# Patient Record
Sex: Female | Born: 1995 | Race: White | Hispanic: No | Marital: Single | Smoking: Never smoker
Health system: Southern US, Community
[De-identification: ages and names within clinical notes are randomized; demographics above are authoritative.]

## PROBLEM LIST (undated history)

## (undated) DIAGNOSIS — F32A Depression, unspecified: Secondary | ICD-10-CM

## (undated) DIAGNOSIS — A749 Chlamydial infection, unspecified: Secondary | ICD-10-CM

## (undated) DIAGNOSIS — F419 Anxiety disorder, unspecified: Secondary | ICD-10-CM

## (undated) DIAGNOSIS — M779 Enthesopathy, unspecified: Secondary | ICD-10-CM

## (undated) DIAGNOSIS — F329 Major depressive disorder, single episode, unspecified: Secondary | ICD-10-CM

---

## 2017-06-14 ENCOUNTER — Emergency Department
Admission: EM | Admit: 2017-06-14 | Discharge: 2017-06-14 | Disposition: A | Discharge status: Home / Self Care | Payer: Commercial Managed Care - PPO | Attending: Emergency Medicine | Admitting: Emergency Medicine

## 2017-06-14 ENCOUNTER — Emergency Department: Payer: Commercial Managed Care - PPO

## 2017-06-14 DIAGNOSIS — Z79899 Other long term (current) drug therapy: Secondary | ICD-10-CM | POA: Insufficient documentation

## 2017-06-14 DIAGNOSIS — R202 Paresthesia of skin: Secondary | ICD-10-CM | POA: Insufficient documentation

## 2017-06-14 DIAGNOSIS — R200 Anesthesia of skin: Secondary | ICD-10-CM | POA: Diagnosis present

## 2017-06-14 HISTORY — DX: Enthesopathy, unspecified: M77.9

## 2017-06-14 LAB — CBC
HCT: 43.1 % (ref 35.0–47.0)
HEMOGLOBIN: 14.8 g/dL (ref 12.0–16.0)
MCH: 29.7 pg (ref 26.0–34.0)
MCHC: 34.4 g/dL (ref 32.0–36.0)
MCV: 86.2 fL (ref 80.0–100.0)
Platelets: 231 10*3/uL (ref 150–440)
RBC: 5 MIL/uL (ref 3.80–5.20)
RDW: 12.9 % (ref 11.5–14.5)
WBC: 7.7 10*3/uL (ref 3.6–11.0)

## 2017-06-14 LAB — COMPREHENSIVE METABOLIC PANEL
ALT: 14 U/L (ref 14–54)
AST: 22 U/L (ref 15–41)
Albumin: 4.5 g/dL (ref 3.5–5.0)
Alkaline Phosphatase: 59 U/L (ref 38–126)
Anion gap: 11 (ref 5–15)
BUN: 7 mg/dL (ref 6–20)
CALCIUM: 9.2 mg/dL (ref 8.9–10.3)
CO2: 24 mmol/L (ref 22–32)
CREATININE: 0.6 mg/dL (ref 0.44–1.00)
Chloride: 102 mmol/L (ref 101–111)
GFR calc non Af Amer: >60 mL/min (ref >60)
GLUCOSE: 92 mg/dL (ref 65–99)
Potassium: 3.6 mmol/L (ref 3.5–5.1)
SODIUM: 137 mmol/L (ref 135–145)
Total Bilirubin: 0.6 mg/dL (ref 0.3–1.2)
Total Protein: 8 g/dL (ref 6.5–8.1)

## 2017-06-14 LAB — POCT PREGNANCY, URINE: Preg Test, Ur: NEGATIVE

## 2017-06-14 MED ORDER — IOPAMIDOL (ISOVUE-370) INJECTION 76%
75.0000 mL | Freq: Once | INTRAVENOUS | Status: AC | PRN
  Administered 2017-06-14: 75 mL via INTRAVENOUS

## 2017-06-14 NOTE — ED Triage Notes (Signed)
Patient reports a feeling of malaise/discomfort. Patient reports she then began to have left arm numbness and left leg numbness. Patient took her BP and it read 152 systolic.  Patient reports hx of previous numbness in extremities following a concussion.

## 2017-06-14 NOTE — ED Provider Notes (Signed)
[facility] Emergency Department Provider Note   I have reviewed the triage vital signs and the nursing notes.   HISTORY  Chief Complaint Hypertension    HPI Kendra Robertson is a 22 y.o. female presents to the emergency department with left arm and leg numbness times 1 day associated with elevated blood pressure at home of 152 systolic.  Patient states that previous episode of the same following a concussion however no head injury at all.  Patient denies any headache.  Patient does admit to dizziness intermittently however patient denies any visual changes.  Patient denies any gait instability.  Patient denies any weakness.   Past Medical History:  Diagnosis Date  . Tendonitis   Concussion Migraine headache  There are no active problems to display for this patient.   Past surgical history None  Prior to Admission medications   Medication Sig Start Date End Date Taking? Authorizing Provider  CRYSELLE-28 0.3-30 MG-MCG tablet Take 1 tablet by mouth daily. 06/06/17   [provider]  escitalopram (LEXAPRO) 10 MG tablet Take 30 mg by mouth daily. 05/26/17   [provider]  ZOMIG 5 MG nasal solution Place 1 spray into the nose as needed for migraine. 04/15/17   [provider]    Allergies No known drug allergies  Social History Social History   Tobacco Use  . Smoking status: Never Smoker  . Smokeless tobacco: Never Used  Substance Use Topics  . Alcohol use: Yes  . Drug use: Not on file    Review of Systems Constitutional: No fever/chills Eyes: No visual changes. ENT: No sore throat. Cardiovascular: Denies chest pain. Respiratory: Denies shortness of breath. Gastrointestinal: No abdominal pain.  No nausea, no vomiting.  No diarrhea.  No constipation. Genitourinary: Negative for dysuria. Musculoskeletal: Negative for neck pain.  Negative for back pain. Integumentary: Negative for rash. Neurological: Negative for  headaches, focal weakness or numbness.   ____________________________________________   PHYSICAL EXAM:  VITAL SIGNS: ED Triage Vitals  Enc Vitals Group     BP 06/14/17 0106 (!) 148/96     Pulse Rate 06/14/17 0106 79     Resp 06/14/17 0106 18     Temp 06/14/17 0106 98.6 F (37 C)     Temp Source 06/14/17 0106 Oral     SpO2 06/14/17 0106 100 %     Weight 06/14/17 0105 72.6 kg (160 lb)     Height 06/14/17 0105 1.676 m (5\' 6" )     Head Circumference --      Peak Flow --      Pain Score 06/14/17 0110 5     Pain Loc --      Pain Edu? --      Excl. in GC? --     Constitutional: Alert and oriented. Well appearing and in no acute distress. Eyes: Conjunctivae are normal. PERRL. EOMI. no nystagmus Head: Atraumatic. Mouth/Throat: Mucous membranes are moist.  Oropharynx non-erythematous. Neck: No stridor.  No meningeal signs.   Cardiovascular: Normal rate, regular rhythm. Good peripheral circulation. Grossly normal heart sounds. Respiratory: Normal respiratory effort.  No retractions. Lungs CTAB. Gastrointestinal: Soft and nontender. No distention.  Musculoskeletal: No lower extremity tenderness nor edema. No gross deformities of extremities. Neurologic:  Normal speech and language. No gross focal neurologic deficits are appreciated.  Skin:  Skin is warm, dry and intact. No rash noted. Psychiatric: Mood and affect are normal. Speech and behavior are normal.  ____________________________________________   LABS (all labs ordered are  listed, but only abnormal results are displayed)  Labs Reviewed  CBC  COMPREHENSIVE METABOLIC PANEL  POCT PREGNANCY, URINE     RADIOLOGY I, Chardon N BROWN, personally viewed and evaluated these images (plain radiographs) as part of my medical decision making, as well as reviewing the written report by the radiologist.    Official radiology report(s): Ct Angio Head W Or Wo Contrast  Result Date: 06/14/2017 CLINICAL DATA:  22 y/o F; malaise  and discomfort. Subsequent left arm numbness and left leg numbness. EXAM: CT ANGIOGRAPHY HEAD TECHNIQUE: Multidetector CT imaging of the head was performed using the standard protocol during bolus administration of intravenous contrast. Multiplanar CT image reconstructions and MIPs were obtained to evaluate the vascular anatomy. CONTRAST:  75mL ISOVUE-370 IOPAMIDOL (ISOVUE-370) INJECTION 76% COMPARISON:  None. FINDINGS: CT HEAD Brain: No evidence of acute infarction, hemorrhage, hydrocephalus, extra-axial collection or mass lesion/mass effect. Vascular: As below. Skull: Normal. Negative for fracture or focal lesion. Sinuses: Imaged portions are clear. Orbits: No acute finding. CTA HEAD Anterior circulation: No significant stenosis, proximal occlusion, aneurysm, or vascular malformation. Posterior circulation: No significant stenosis, proximal occlusion, aneurysm, or vascular malformation. Venous sinuses: As permitted by contrast timing, patent. Anatomic variants: Bilateral posterior and anterior communicating arteries are present. Delayed phase: No abnormal intracranial enhancement. IMPRESSION: 1. Negative noncontrast CT of head. No abnormal enhancement of the brain. 2. Patent anterior and posterior intracranial circulation. No large vessel occlusion, aneurysm, or significant stenosis. Electronically Signed   By: Mitzi Hansen M.D.   On: 06/14/2017 03:08    Procedures   ____________________________________________   INITIAL IMPRESSION / ASSESSMENT AND PLAN / ED COURSE  As part of my medical decision making, I reviewed the following data within the electronic MEDICAL RECORD NUMBER21 year old female presented with above-stated history of dizziness with left arm and leg numbness.  No focal neurological deficits noted on exam including no numbness of the left upper or lower extremity.  CT angiogram of the brain revealed no acute intracranial abnormality including no vascular abnormalities as well.  No  clear etiology for the patient's left arm and leg numbness.  Patient will be referred to neurology for outpatient follow-up for further evaluation. ____________________________________________  FINAL CLINICAL IMPRESSION(S) / ED DIAGNOSES  Final diagnoses:  Paresthesia     MEDICATIONS GIVEN DURING THIS VISIT:  Medications  iopamidol (ISOVUE-370) 76 % injection 75 mL (75 mLs Intravenous Contrast Given 06/14/17 0241)     ED Discharge Orders    None       Note:  This document was prepared using Dragon voice recognition software and may include unintentional dictation errors.    Darci Current, MD 06/14/17 [phone]

## 2017-06-21 ENCOUNTER — Encounter: Payer: Self-pay | Admitting: Intensive Care

## 2017-06-21 ENCOUNTER — Emergency Department
Admission: EM | Admit: 2017-06-21 | Discharge: 2017-06-21 | Disposition: A | Discharge status: Home / Self Care | Payer: Commercial Managed Care - PPO | Attending: Emergency Medicine | Admitting: Emergency Medicine

## 2017-06-21 DIAGNOSIS — A749 Chlamydial infection, unspecified: Secondary | ICD-10-CM | POA: Insufficient documentation

## 2017-06-21 DIAGNOSIS — R110 Nausea: Secondary | ICD-10-CM | POA: Insufficient documentation

## 2017-06-21 DIAGNOSIS — T887XXA Unspecified adverse effect of drug or medicament, initial encounter: Secondary | ICD-10-CM | POA: Diagnosis not present

## 2017-06-21 DIAGNOSIS — T50905A Adverse effect of unspecified drugs, medicaments and biological substances, initial encounter: Secondary | ICD-10-CM | POA: Diagnosis not present

## 2017-06-21 DIAGNOSIS — R030 Elevated blood-pressure reading, without diagnosis of hypertension: Secondary | ICD-10-CM | POA: Diagnosis not present

## 2017-06-21 DIAGNOSIS — Z79899 Other long term (current) drug therapy: Secondary | ICD-10-CM | POA: Insufficient documentation

## 2017-06-21 DIAGNOSIS — Y69 Unspecified misadventure during surgical and medical care: Secondary | ICD-10-CM | POA: Insufficient documentation

## 2017-06-21 DIAGNOSIS — R002 Palpitations: Secondary | ICD-10-CM | POA: Diagnosis not present

## 2017-06-21 HISTORY — DX: Anxiety disorder, unspecified: F41.9

## 2017-06-21 HISTORY — DX: Depression, unspecified: F32.A

## 2017-06-21 HISTORY — DX: Chlamydial infection, unspecified: A74.9

## 2017-06-21 HISTORY — DX: Major depressive disorder, single episode, unspecified: F32.9

## 2017-06-21 LAB — BASIC METABOLIC PANEL
ANION GAP: 8 (ref 5–15)
BUN: 7 mg/dL (ref 6–20)
CALCIUM: 9 mg/dL (ref 8.9–10.3)
CHLORIDE: 105 mmol/L (ref 101–111)
CO2: 25 mmol/L (ref 22–32)
Creatinine, Ser: 0.74 mg/dL (ref 0.44–1.00)
GFR calc non Af Amer: >60 mL/min (ref >60)
Glucose, Bld: 114 mg/dL — ABNORMAL HIGH (ref 65–99)
Potassium: 3.8 mmol/L (ref 3.5–5.1)
SODIUM: 138 mmol/L (ref 135–145)

## 2017-06-21 LAB — CBC
HCT: 41.9 % (ref 35.0–47.0)
HEMOGLOBIN: 14.4 g/dL (ref 12.0–16.0)
MCH: 29.8 pg (ref 26.0–34.0)
MCHC: 34.3 g/dL (ref 32.0–36.0)
MCV: 87 fL (ref 80.0–100.0)
PLATELETS: 284 10*3/uL (ref 150–440)
RBC: 4.81 MIL/uL (ref 3.80–5.20)
RDW: 13.1 % (ref 11.5–14.5)
WBC: 8.9 10*3/uL (ref 3.6–11.0)

## 2017-06-21 LAB — TROPONIN I

## 2017-06-21 NOTE — ED Triage Notes (Signed)
FIRST NURSE NOTE-here because reports a shot she got interacts with a med she takes and is dizzy. Ambulatory with steady gait. Declined wheelchair.

## 2017-06-21 NOTE — Discharge Instructions (Signed)
Return to the emergency department for new, worsening, or recurrent dizziness, weakness, palpitations, headache, vomiting, fevers, or any other new or worsening symptoms that concern you.  Follow-up with the neurologist tomorrow as planned.  Continue your normal medications.

## 2017-06-21 NOTE — ED Provider Notes (Signed)
[facility] Emergency Department Provider Note ____________________________________________   First MD Initiated Contact with Patient 06/21/17 2023     (approximate)  I have reviewed the triage vital signs and the nursing notes.   HISTORY  Chief Complaint No chief complaint on file.    HPI Kendra Robertson is a 22 y.o. female with past medical history as noted below who presents with palpitations, acute onset approximately 1 hour after being given a 1 g dose of azithromycin, associated with nausea but no vomiting, lightheadedness and dizziness, and diaphoresis.  Patient states symptoms are mostly resolved at this time.  She states that she looked up with the adverse effects of azithromycin and was concerned because she had several of them.  She also looked up interactions between that and her escitalopram and found that there was an interaction so she became concerned which caused her to come to the emergency department.  Patient states that she was given the azithromycin for chlamydia.  No other new medications, and this is the only dose that she is supposed to take.  Past Medical History:  Diagnosis Date  . Anxiety   . Chlamydia   . Depression   . Tendonitis     There are no active problems to display for this patient.   History reviewed. No pertinent surgical history.  Prior to Admission medications   Medication Sig Start Date End Date Taking? Authorizing Provider  CRYSELLE-28 0.3-30 MG-MCG tablet Take 1 tablet by mouth daily. 06/06/17   [provider]  escitalopram (LEXAPRO) 10 MG tablet Take 30 mg by mouth daily. 05/26/17   [provider]  ZOMIG 5 MG nasal solution Place 1 spray into the nose as needed for migraine. 04/15/17   [provider]    Allergies Patient has no known allergies.  History reviewed. No pertinent family history.  Social History Social History   Tobacco Use  . Smoking status: Never Smoker  .  Smokeless tobacco: Never Used  Substance Use Topics  . Alcohol use: Yes  . Drug use: No    Review of Systems  Constitutional: No fever/chills. Eyes: No visual changes. ENT: No sore throat. Cardiovascular: Positive for resolved palpitations. Respiratory: Positive resolved shortness of breath. Gastrointestinal: Positive for resolved nausea, no vomiting.    Genitourinary: Negative for dysuria.  Musculoskeletal: Negative for back pain. Skin: Negative for rash. Neurological: Negative for headache.   ____________________________________________   PHYSICAL EXAM:  VITAL SIGNS: ED Triage Vitals  Enc Vitals Group     BP 06/21/17 1839 (!) 152/98     Pulse Rate 06/21/17 1839 83     Resp 06/21/17 1839 16     Temp 06/21/17 1839 99.7 F (37.6 C)     Temp Source 06/21/17 1839 Oral     SpO2 06/21/17 1839 99 %     Weight 06/21/17 1840 160 lb (72.6 kg)     Height 06/21/17 1840 5\' 6"  (1.676 m)     Head Circumference --      Peak Flow --      Pain Score --      Pain Loc --      Pain Edu? --      Excl. in GC? --     Constitutional: Alert and oriented. Well appearing and in no acute distress. Eyes: Conjunctivae are normal.  EOMI.  PR early. Head: Atraumatic. Nose: No congestion/rhinnorhea. Mouth/Throat: Mucous membranes are moist.   Neck: Normal range of motion.  Cardiovascular: Normal rate, regular  rhythm. Grossly normal heart sounds.  Good peripheral circulation. Respiratory: Normal respiratory effort.  No retractions. Lungs CTAB. Gastrointestinal: Soft and nontender. No distention.  Genitourinary: No flank tenderness. Musculoskeletal: No lower extremity edema.  Extremities warm and well perfused.  Neurologic:  Normal speech and language.  Motor and sensory intact in all extremities.  No gross focal neurologic deficits are appreciated.  Skin:  Skin is warm and dry. No rash noted. Psychiatric: Mood and affect are normal. Speech and behavior are  normal.  ____________________________________________   LABS (all labs ordered are listed, but only abnormal results are displayed)  Labs Reviewed  BASIC METABOLIC PANEL - Abnormal; Notable for the following components:      Result Value   Glucose, Bld 114 (*)    All other components within normal limits  CBC  TROPONIN I  POC URINE PREG, ED   ____________________________________________  EKG  ED ECG REPORT I, Dionne Bucy, the attending physician, personally viewed and interpreted this ECG.  Date: 06/21/2017 EKG Time: 1848 Rate: 92 Rhythm: normal sinus rhythm QRS Axis: normal Intervals: normal including QT ST/T Wave abnormalities: normal Narrative Interpretation: no evidence of acute ischemia or other acute abnormalities  ____________________________________________  RADIOLOGY    ____________________________________________   PROCEDURES  Procedure(s) performed: No  Procedures  Critical Care performed: No ____________________________________________   INITIAL IMPRESSION / ASSESSMENT AND PLAN / ED COURSE  Pertinent labs & imaging results that were available during my care of the patient were reviewed by me and considered in my medical decision making (see chart for details).  22 year old female presents with acute onset of palpitations, nausea, lightheadedness, and diaphoresis after taking a 1 g dose of azithromycin.  Patient became concerned for medication side effects or a possible interaction with her escitalopram.  No other new medications.  Patient states that the symptoms have now mostly resolved.  On exam, the patient is well-appearing, the vital signs are normal except for slight hypertension and the remainder of the exam is unremarkable.  Lab workup and EKG are unremarkable.  Presentation is consistent with normal side effects of large dose of azithromycin, with possible vasovagal near syncope as a result.  Azithromycin and escitalopram can  both cause prolonged QT however patient's QT is normal at this time and there are no other concerning findings on the EKG.  Vital signs are stable.  At this time, there is no indication for further workup or observation.  The patient feels well and would like to go home.  Return precautions given, the patient expresses understanding.  Of note, the patient was seen in the ED last week for left arm numbness and had a negative CT angios.  She was referred to neurology.  Patient states that she has been having the same numbness intermittently since that time, and that it was slightly worse during this episode but is now improved.  There are no gross neurologic deficits on exam.  Patient has follow-up with neurology arranged for tomorrow.     ____________________________________________   FINAL CLINICAL IMPRESSION(S) / ED DIAGNOSES  Final diagnoses:  Medication adverse effect, initial encounter      NEW MEDICATIONS STARTED DURING THIS VISIT:  New Prescriptions   No medications on file     Note:  This document was prepared using Dragon voice recognition software and may include unintentional dictation errors.    Dionne Bucy, MD 06/21/17 2048

## 2017-06-21 NOTE — ED Notes (Signed)

## 2017-06-21 NOTE — ED Notes (Addendum)
Pt reports taking azithromycin at 1630 and states around 1730 having dizziness, "feeling weird" per pt, heart racing and nausea, denies emesis. Pt states she checked the rxn symptoms on medication and states she had "a lot them" and pt also stated she checked to see if her escitalopram could increase a medication rxn and pt states "it shows that it does." Pt states she has been on excitalopram for "many years." Pt states since being here her symptoms have decreased. Pt also states she continues to have numbness and tingling to left hand which pt states she was seen for last week here and told to see neurologist which pt states she has appt tomorrow. Pt has no SHOB at this time or difficulty breathing. Pt A&O and in NAD at this time.

## 2017-06-21 NOTE — ED Triage Notes (Addendum)
Around 4:30pm patient reports taking azithromycin and then around 5:30 started having dizziness, nausea, palpitations, and the sweats. Patient reports having same symptoms at this time just not as severe. Ambulatory into triage with no problems. Denies trouble breathing at this time but reports had felt SOB earlier. Patient reports taking azithromycin for recent diagnosis of chlamydia.

## 2017-07-05 ENCOUNTER — Other Ambulatory Visit: Payer: Self-pay | Admitting: Neurology

## 2017-07-05 DIAGNOSIS — G729 Myopathy, unspecified: Secondary | ICD-10-CM

## 2017-07-05 DIAGNOSIS — M5412 Radiculopathy, cervical region: Secondary | ICD-10-CM

## 2017-07-12 ENCOUNTER — Ambulatory Visit
Admission: RE | Admit: 2017-07-12 | Discharge: 2017-07-12 | Disposition: A | Discharge status: Home / Self Care | Payer: Commercial Managed Care - PPO | Source: Ambulatory Visit | Attending: Neurology | Admitting: Neurology

## 2017-07-12 DIAGNOSIS — G959 Disease of spinal cord, unspecified: Secondary | ICD-10-CM | POA: Diagnosis present

## 2017-07-12 DIAGNOSIS — G729 Myopathy, unspecified: Secondary | ICD-10-CM

## 2017-07-12 DIAGNOSIS — M5412 Radiculopathy, cervical region: Secondary | ICD-10-CM | POA: Insufficient documentation

## 2017-09-01 ENCOUNTER — Other Ambulatory Visit: Payer: Self-pay | Admitting: Nurse Practitioner

## 2018-01-02 ENCOUNTER — Other Ambulatory Visit: Payer: Self-pay

## 2018-01-02 ENCOUNTER — Emergency Department
Admission: EM | Admit: 2018-01-02 | Discharge: 2018-01-03 | Disposition: A | Discharge status: Home / Self Care | Payer: Commercial Managed Care - PPO | Attending: Emergency Medicine | Admitting: Emergency Medicine

## 2018-01-02 ENCOUNTER — Ambulatory Visit
Admission: EM | Admit: 2018-01-02 | Discharge: 2018-01-02 | Disposition: A | Discharge status: Home / Self Care | Payer: No Typology Code available for payment source | Source: Ambulatory Visit | Attending: Emergency Medicine | Admitting: Emergency Medicine

## 2018-01-02 DIAGNOSIS — Z79899 Other long term (current) drug therapy: Secondary | ICD-10-CM | POA: Insufficient documentation

## 2018-01-02 DIAGNOSIS — Z0441 Encounter for examination and observation following alleged adult rape: Secondary | ICD-10-CM | POA: Insufficient documentation

## 2018-01-02 DIAGNOSIS — T7421XA Adult sexual abuse, confirmed, initial encounter: Secondary | ICD-10-CM | POA: Insufficient documentation
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
## 2018-01-02 NOTE — ED Notes (Signed)
Pt denies injury/wound, pain or any ailment that warrants medical attention.
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
## 2018-01-03 MED ORDER — AZITHROMYCIN 500 MG PO TABS
1000.0000 mg | ORAL_TABLET | Freq: Once | ORAL | Status: AC
  Administered 2018-01-03: 1000 mg via ORAL
  Filled 2018-01-03: qty 2

## 2018-01-03 MED ORDER — CEFTRIAXONE SODIUM 250 MG IJ SOLR
250.0000 mg | Freq: Once | INTRAMUSCULAR | Status: AC
  Administered 2018-01-03: 250 mg via INTRAMUSCULAR
  Filled 2018-01-03: qty 250

## 2018-01-03 MED ORDER — METRONIDAZOLE 500 MG PO TABS
2000.0000 mg | ORAL_TABLET | Freq: Once | ORAL | Status: AC
  Administered 2018-01-03: 2000 mg via ORAL
  Filled 2018-01-03: qty 4

## 2018-01-03 MED ORDER — ULIPRISTAL ACETATE 30 MG PO TABS
30.0000 mg | ORAL_TABLET | Freq: Once | ORAL | Status: AC
  Administered 2018-01-03: 30 mg via ORAL
  Filled 2018-01-03: qty 1

## 2018-01-03 MED ORDER — LIDOCAINE HCL (PF) 1 % IJ SOLN
0.9000 mL | Freq: Once | INTRAMUSCULAR | Status: AC
  Administered 2018-01-03: 0.9 mL
  Filled 2018-01-03: qty 5
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
## 2018-01-04 LAB — POCT PREGNANCY, URINE: PREG TEST UR: NEGATIVE

## 2018-01-04 NOTE — SANE Note (Signed)
Follow-up Phone Call  Patient gives verbal consent for a FNE/SANE follow-up phone call in 48-72 hours: NO Patient's telephone number: [phone] Patient gives verbal consent to leave voicemail at the phone number listed above: No DO NOT CALL between the hours of: declines follow up
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
## 2018-01-10 ENCOUNTER — Other Ambulatory Visit (HOSPITAL_COMMUNITY)
Admission: RE | Admit: 2018-01-10 | Discharge: 2018-01-10 | Disposition: A | Discharge status: Home / Self Care | Payer: Commercial Managed Care - PPO | Source: Ambulatory Visit | Attending: Obstetrics and Gynecology | Admitting: Obstetrics and Gynecology

## 2018-01-10 ENCOUNTER — Encounter: Payer: Self-pay | Admitting: Obstetrics and Gynecology

## 2018-01-10 ENCOUNTER — Ambulatory Visit (INDEPENDENT_AMBULATORY_CARE_PROVIDER_SITE_OTHER): Payer: Commercial Managed Care - PPO | Admitting: Obstetrics and Gynecology

## 2018-01-10 ENCOUNTER — Ambulatory Visit
Admission: RE | Admit: 2018-01-10 | Discharge: 2018-01-10 | Disposition: A | Discharge status: Home / Self Care | Payer: Commercial Managed Care - PPO | Source: Ambulatory Visit | Attending: Obstetrics and Gynecology | Admitting: Obstetrics and Gynecology

## 2018-01-10 VITALS — BP 140/90 | HR 72 | Ht 66.0 in | Wt 171.0 lb

## 2018-01-10 DIAGNOSIS — T7421XD Adult sexual abuse, confirmed, subsequent encounter: Secondary | ICD-10-CM | POA: Insufficient documentation

## 2018-01-10 DIAGNOSIS — Z202 Contact with and (suspected) exposure to infections with a predominantly sexual mode of transmission: Secondary | ICD-10-CM

## 2018-01-10 DIAGNOSIS — Z113 Encounter for screening for infections with a predominantly sexual mode of transmission: Secondary | ICD-10-CM | POA: Diagnosis not present

## 2018-01-10 NOTE — Progress Notes (Signed)
Normal xray discussed on the phone, released to mychart.
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
## 2018-01-12 LAB — CERVICOVAGINAL ANCILLARY ONLY
BACTERIAL VAGINITIS: NEGATIVE
CANDIDA VAGINITIS: NEGATIVE
Chlamydia: NEGATIVE
Neisseria Gonorrhea: NEGATIVE
Trichomonas: NEGATIVE

## 2018-01-13 ENCOUNTER — Telehealth: Payer: Self-pay

## 2018-01-13 NOTE — Telephone Encounter (Signed)
Pt was seen 10/10/17 and needs the chart notes sent to her either by MyChart or her email.  [phone]

## 2018-01-14 NOTE — Telephone Encounter (Signed)
Patient aware to fill out medical release form for records

## 2018-02-10 ENCOUNTER — Ambulatory Visit: Payer: Commercial Managed Care - PPO | Admitting: Obstetrics and Gynecology

## 2019-08-08 IMAGING — MR MR CERVICAL SPINE W/O CM
5 series · 34 of 48 positions shown · non-contrast
Comparison: None.

CLINICAL DATA: Cervical radiculopathy. Neck pain worse on the left
with left arm numbness for 1 month. Cervical myopathy

EXAM:
MRI CERVICAL SPINE WITHOUT CONTRAST
TECHNIQUE: Multiplanar, multisequence MR imaging of the cervical spine was
performed. No intravenous contrast was administered.

[Series 6: T2 · sagittal · 3.0mm · 0.70mm/px · 6 of 13 slices shown (1 of 2)]
[im 1/13]
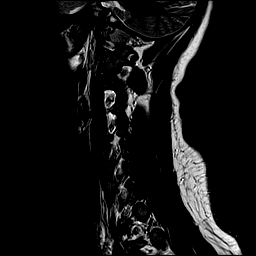
[im 3/13]
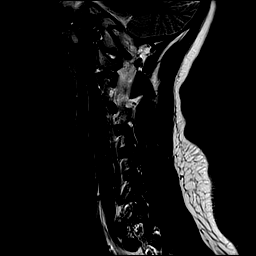
[im 5/13]
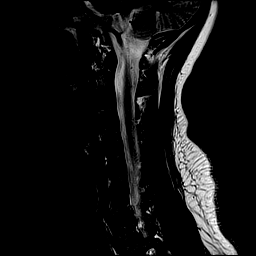
[im 8/13]
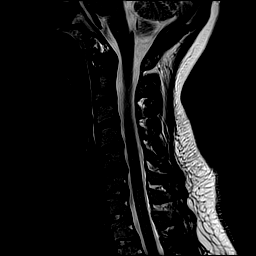
[im 10/13]
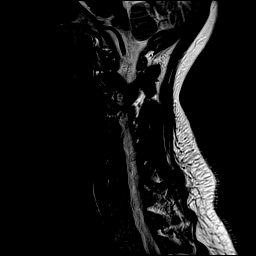
[im 13/13]
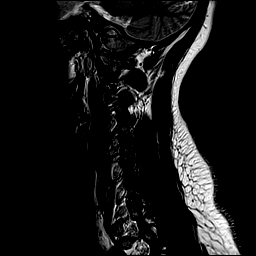

[Series 7: T1 · sagittal · 3.0mm · 0.70mm/px · 7 of 13 slices shown]
[im 1/13]
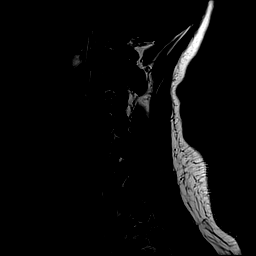
[im 3/13]
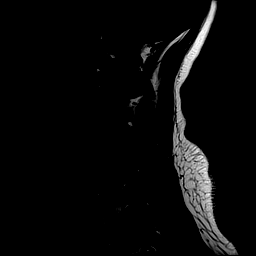
[im 5/13]
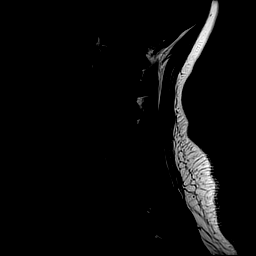
[im 7/13]
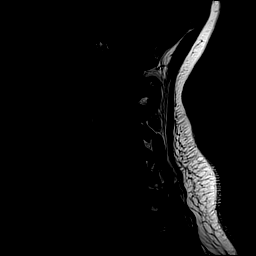
[im 9/13]
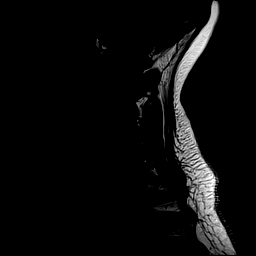
[im 11/13]
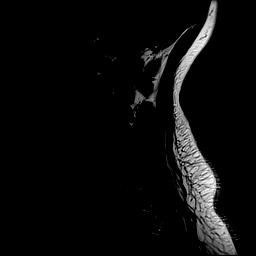
[im 13/13]
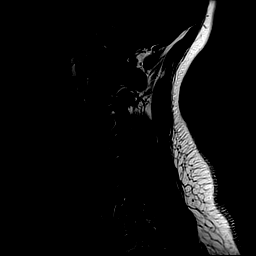

[Series 8: STIR · sagittal · 3.0mm · 0.35mm/px · 7 of 13 slices shown]
[im 1/13]
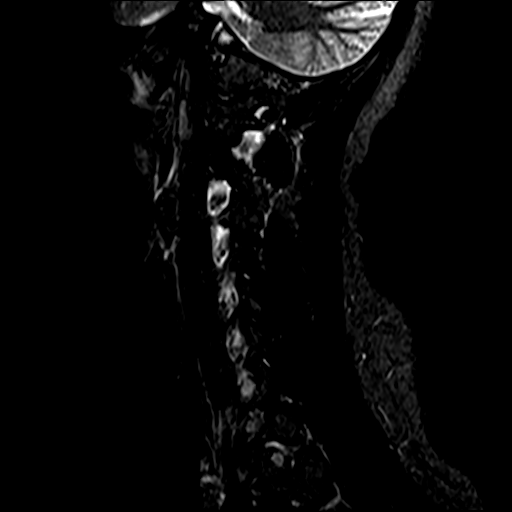
[im 3/13]
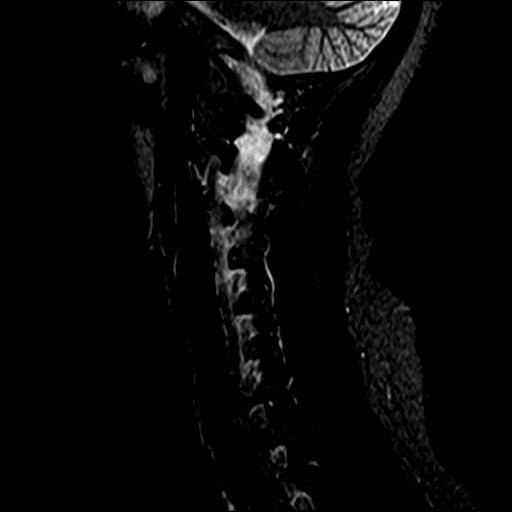
[im 5/13]
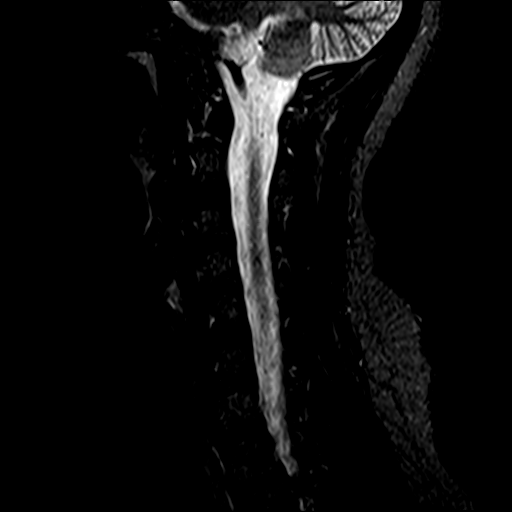
[im 7/13]
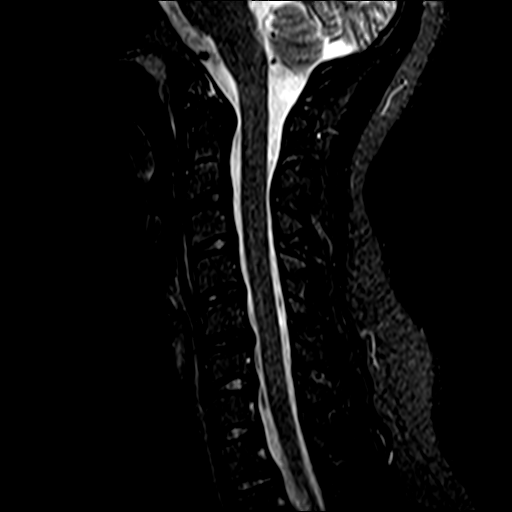
[im 9/13]
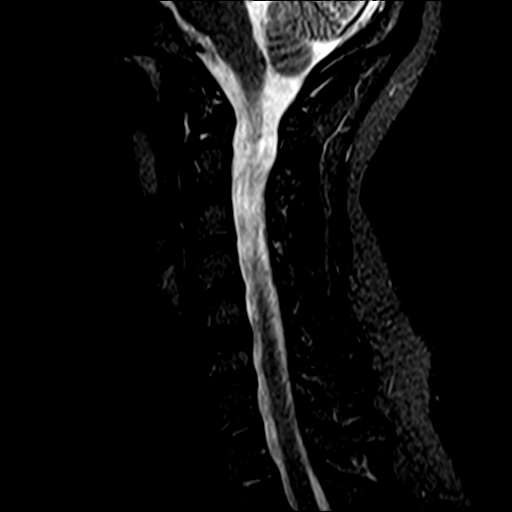
[im 11/13]
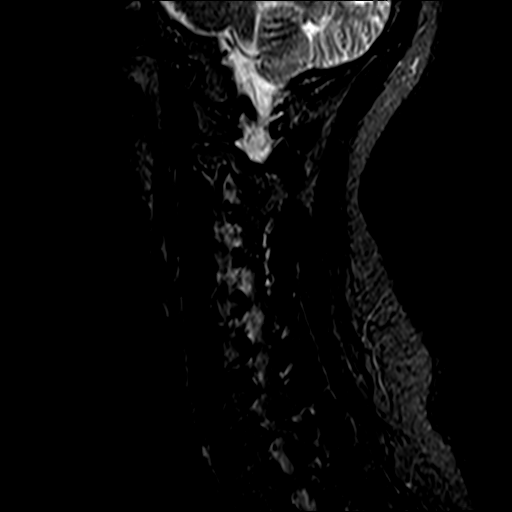
[im 13/13]
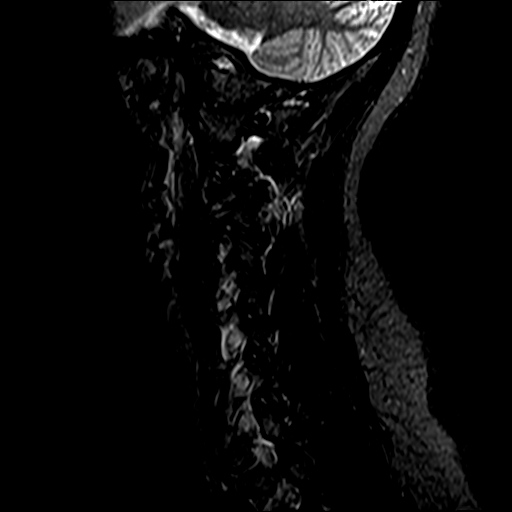

[Series 9: T2 · axial · 3.0mm · 0.70mm/px · z∈[-84,+19]mm · 8 of 28 slices shown (2 of 2)]
[im 1/28]
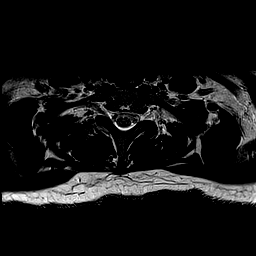
[im 5/28]
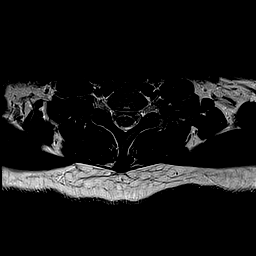
[im 9/28]
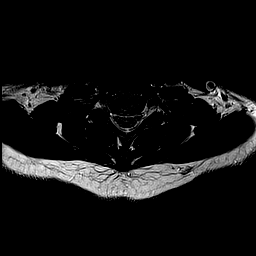
[im 13/28]
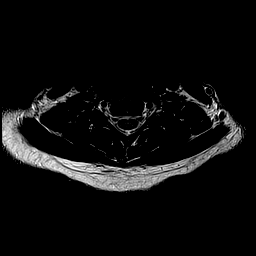
[im 15/28]
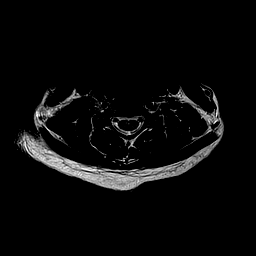
[im 19/28]
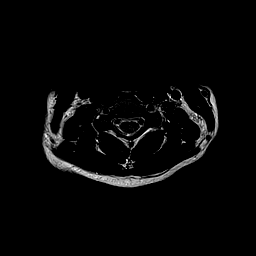
[im 23/28]
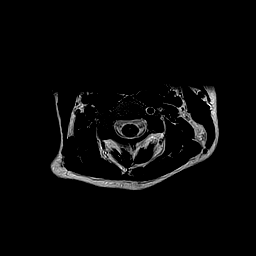
[im 28/28]
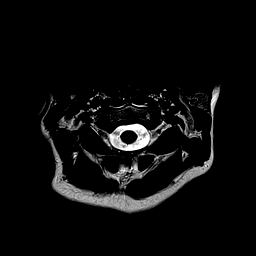

[Series 10: mpgr ax · axial · 3.0mm · 0.35mm/px · z∈[-84,-15]mm · 6 of 28 slices shown]
[im 1/28]
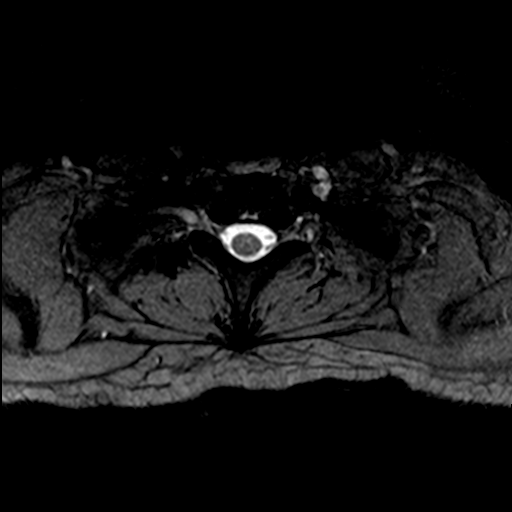
[im 5/28]
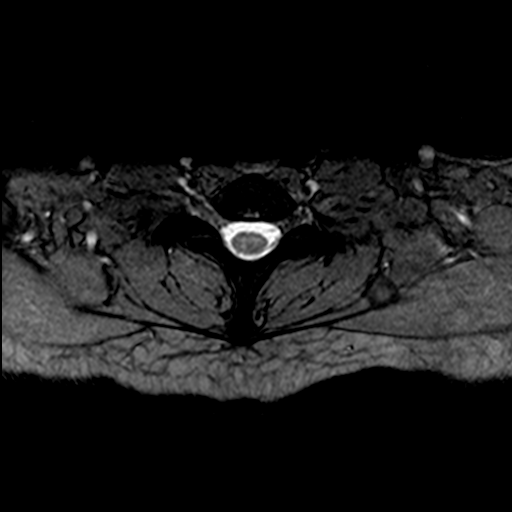
[im 9/28]
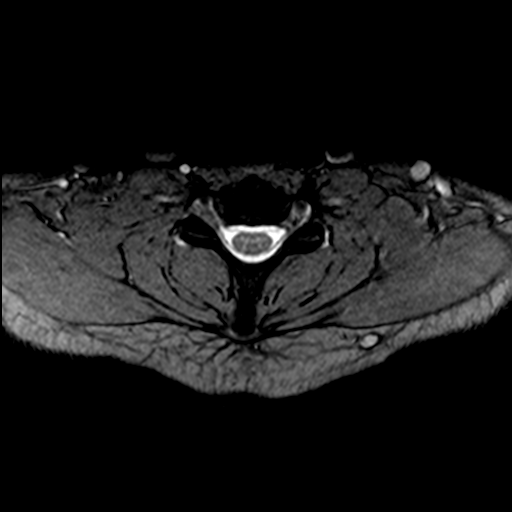
[im 13/28]
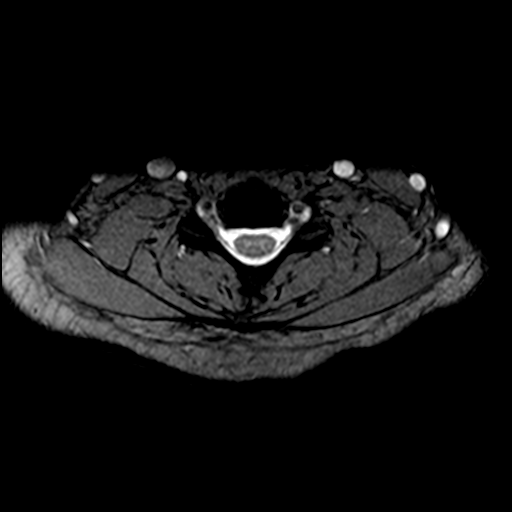
[im 15/28]
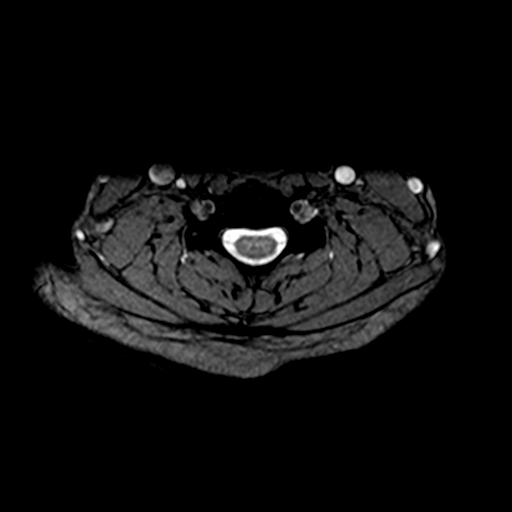
[im 19/28]
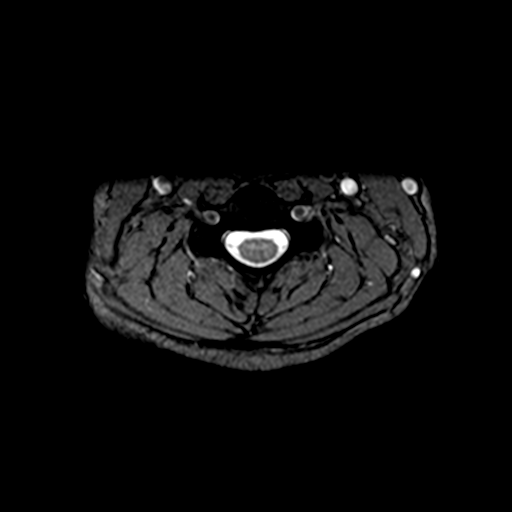

[34 of 48 positions shown; findings below may reference images not displayed]

FINDINGS: Alignment: Physiologic.

Vertebrae: No fracture, evidence of discitis, or bone lesion.

Cord: Normal signal and morphology.

Posterior Fossa, vertebral arteries, paraspinal tissues: Negative.

Disc levels:

Diffuse preservation of disc height. Mild, relative disc desiccation
with tiny central protrusion at C5-6 (see gradient acquisition). No
impingement. No facet degeneration.
IMPRESSION: 1. No impingement to explain history of radiculopathy.
2. Tiny central protrusion at C5-6.
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
